# Patient Record
Sex: Male | Born: 2000
Health system: Southern US, Community
[De-identification: ages and names within clinical notes are randomized; demographics above are authoritative.]

## PROBLEM LIST (undated history)

## (undated) DIAGNOSIS — F319 Bipolar disorder, unspecified: Secondary | ICD-10-CM

## (undated) DIAGNOSIS — F419 Anxiety disorder, unspecified: Secondary | ICD-10-CM

## (undated) HISTORY — PX: CYST EXCISION: SHX5701

---
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
## 2020-02-28 ENCOUNTER — Emergency Department (HOSPITAL_COMMUNITY)
Admission: EM | Admit: 2020-02-28 | Discharge: 2020-02-28 | Disposition: A | Discharge status: Home / Self Care | Payer: Medicaid Other | Attending: Emergency Medicine | Admitting: Emergency Medicine

## 2020-02-28 ENCOUNTER — Emergency Department (HOSPITAL_COMMUNITY): Payer: Medicaid Other

## 2020-02-28 ENCOUNTER — Encounter (HOSPITAL_COMMUNITY): Payer: Self-pay | Admitting: *Deleted

## 2020-02-28 DIAGNOSIS — Y999 Unspecified external cause status: Secondary | ICD-10-CM | POA: Insufficient documentation

## 2020-02-28 DIAGNOSIS — S62339A Displaced fracture of neck of unspecified metacarpal bone, initial encounter for closed fracture: Secondary | ICD-10-CM

## 2020-02-28 DIAGNOSIS — W2209XA Striking against other stationary object, initial encounter: Secondary | ICD-10-CM | POA: Diagnosis not present

## 2020-02-28 DIAGNOSIS — S6992XA Unspecified injury of left wrist, hand and finger(s), initial encounter: Secondary | ICD-10-CM | POA: Diagnosis present

## 2020-02-28 DIAGNOSIS — Y92000 Kitchen of unspecified non-institutional (private) residence as  the place of occurrence of the external cause: Secondary | ICD-10-CM | POA: Insufficient documentation

## 2020-02-28 DIAGNOSIS — S60222A Contusion of left hand, initial encounter: Secondary | ICD-10-CM | POA: Insufficient documentation

## 2020-02-28 DIAGNOSIS — R937 Abnormal findings on diagnostic imaging of other parts of musculoskeletal system: Secondary | ICD-10-CM | POA: Diagnosis not present

## 2020-02-28 DIAGNOSIS — Y9389 Activity, other specified: Secondary | ICD-10-CM | POA: Diagnosis not present

## 2020-02-28 HISTORY — DX: Bipolar disorder, unspecified: F31.9

## 2020-02-28 MED ORDER — IBUPROFEN 600 MG PO TABS: 600.0000 mg | ORAL_TABLET | Freq: Four times a day (QID) | ORAL | 0 refills | Status: AC | PRN

## 2020-02-28 NOTE — Discharge Instructions (Signed)
As discussed your x-ray suggest the possibility of a subtle boxer's fracture of your fifth metacarpal bone as pointed out on your x-ray.  You also have deep bruising and swelling of your hand which will cause pain and difficulty making a full fist until this injury heals.  You have been placed in a splint to protect your injury until reexamined by orthopedics.  Call Dr. Romeo Apple for recheck appointment this coming week who will determine any ongoing need for this splint use.  In the interim ice and elevate your hand as much as possible.  Take the anti-inflammatory pain reliever prescribed to help you with pain symptoms.
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
## 2020-02-28 NOTE — ED Triage Notes (Signed)
Pt with left hand pain after hitting the kitchen counter on possible last night.  Small cuts from breaking the dishes.

## 2020-12-02 IMAGING — DX DG HAND COMPLETE 3+V*L*
3 series · 3 of 3 positions shown · non-contrast
Comparison: None.

CLINICAL DATA: Hit hand on solid object

EXAM:
LEFT HAND - COMPLETE 3+ VIEW

[hand pa]
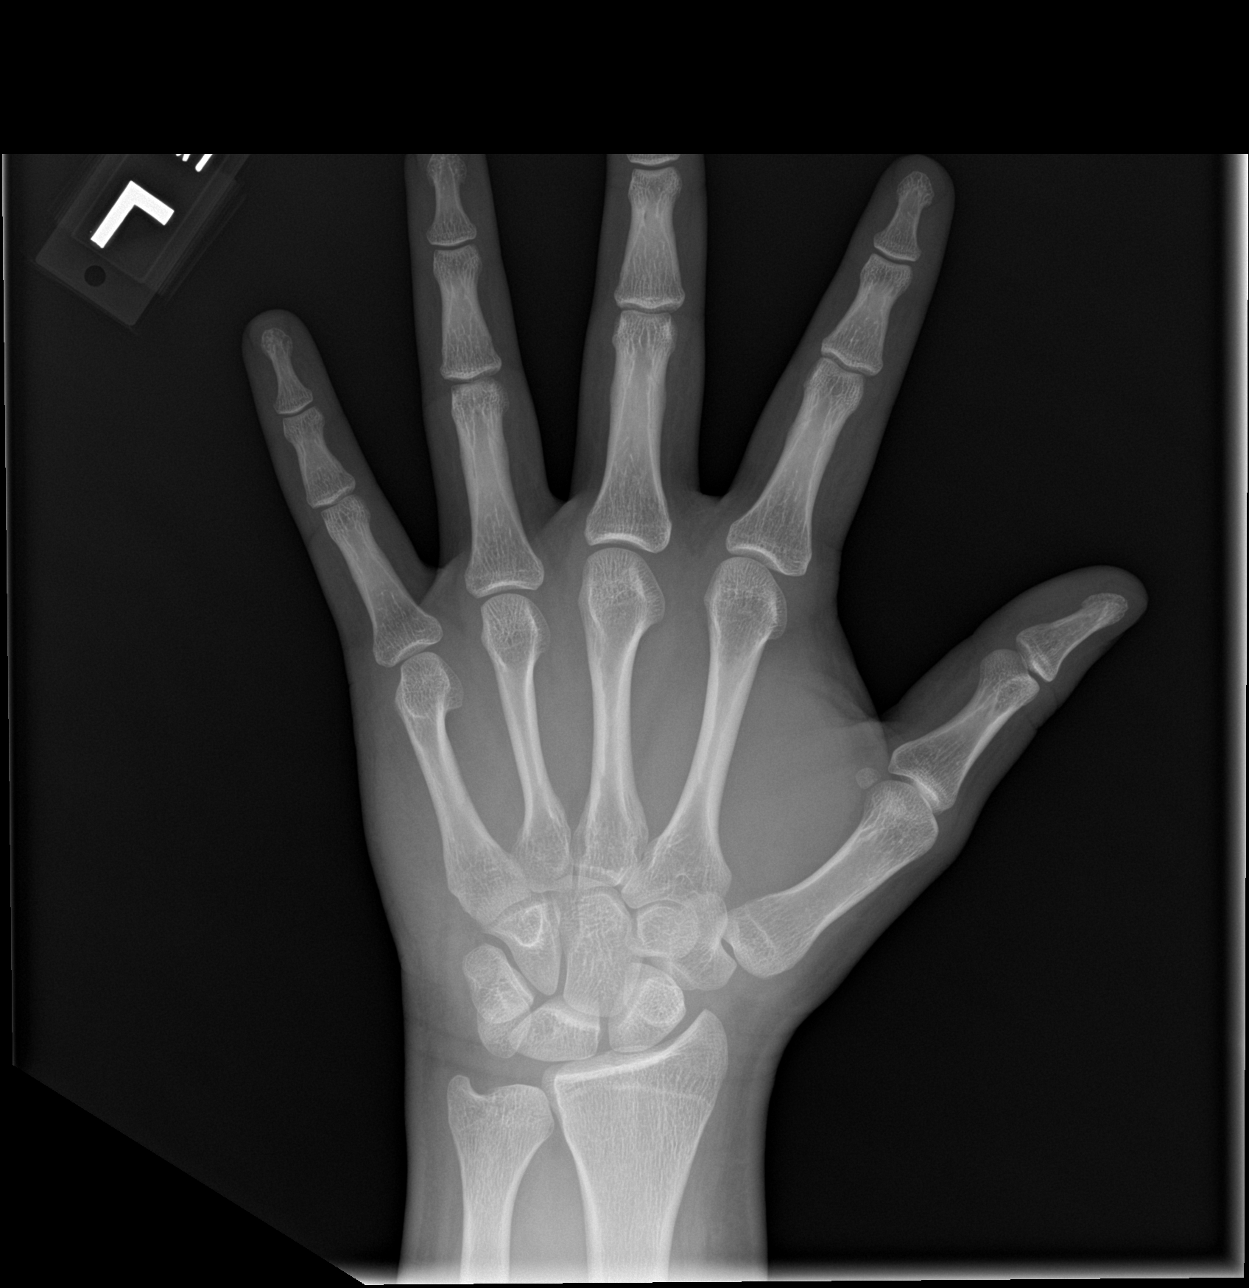

[hand obl]
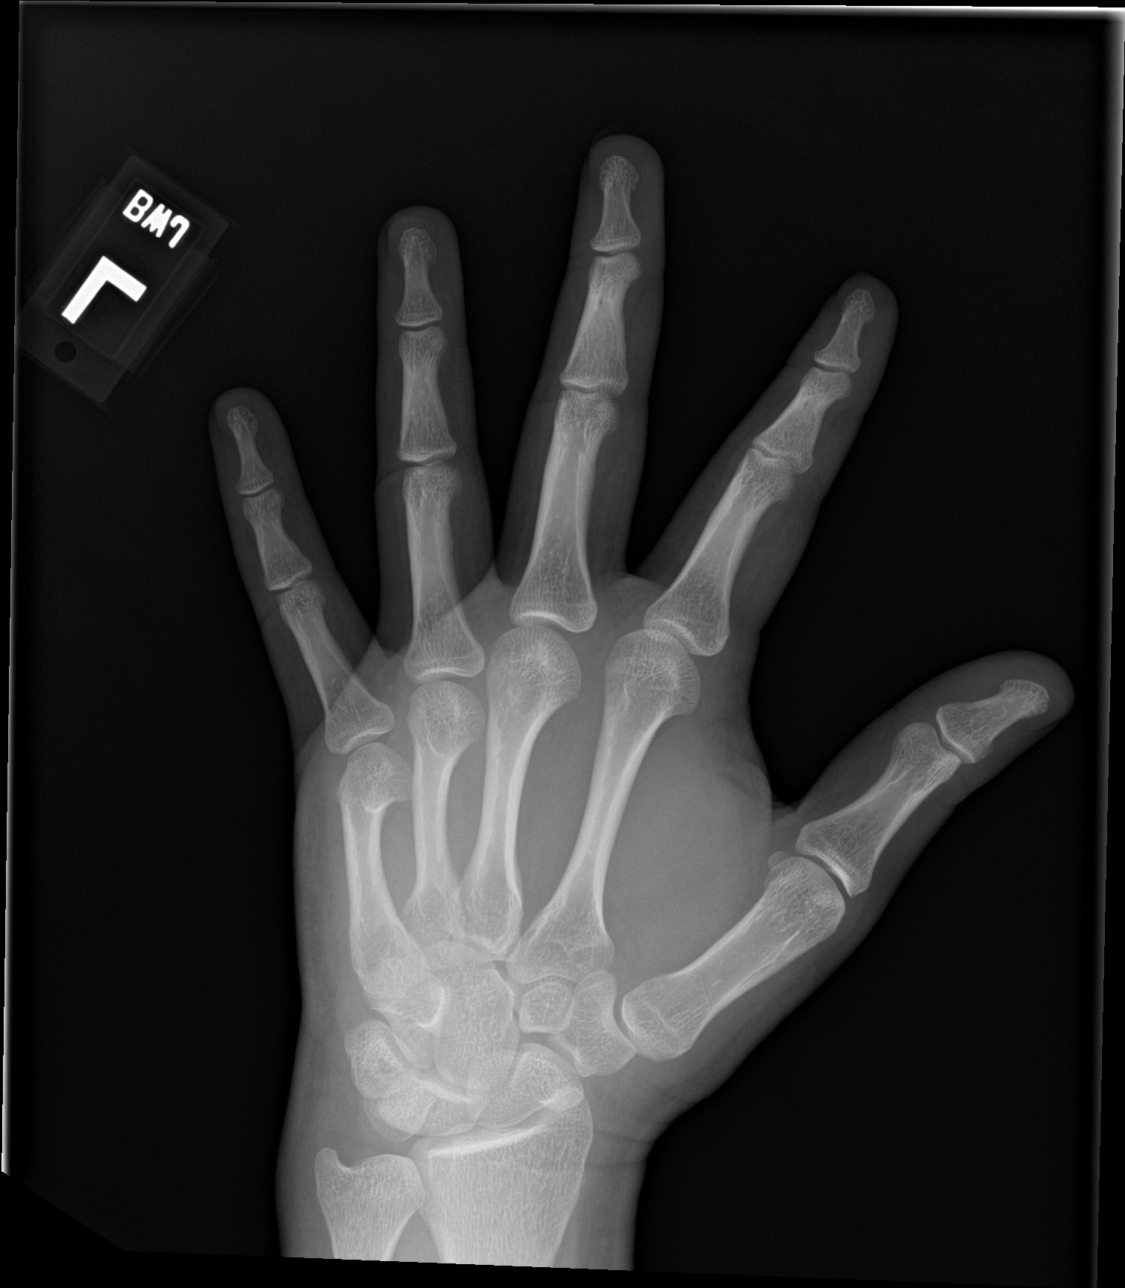

[hand lat]
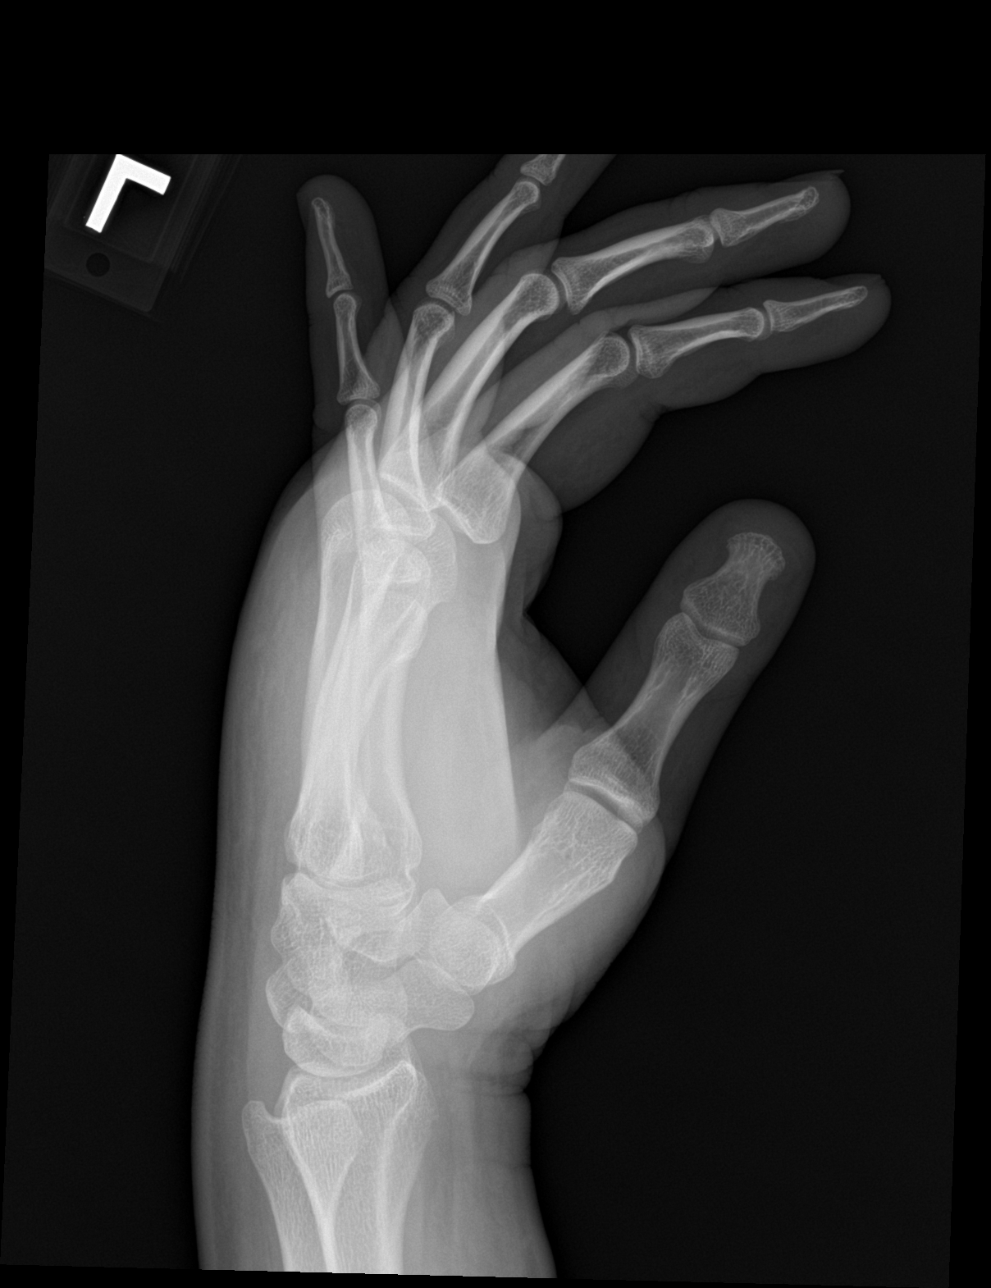

[3 of 3 positions shown; findings below may reference images not displayed]

FINDINGS: Frontal, oblique, and lateral views were obtained. There is a
questionable impaction injury along the distal radial metaphysis
with essentially anatomic alignment. No other findings suggesting
potential fracture. No dislocation. Joint spaces appear normal. No
erosive change. There is soft tissue swelling dorsally.
IMPRESSION: Soft tissue swelling dorsally. Question subtle impaction at the
distal left fifth metacarpal. Clinical assessment of this area
warranted for further evaluation. No other findings suggesting
potential fracture. No dislocation. No evident arthropathy.

## 2023-08-14 ENCOUNTER — Emergency Department (HOSPITAL_BASED_OUTPATIENT_CLINIC_OR_DEPARTMENT_OTHER)
Admission: EM | Admit: 2023-08-14 | Discharge: 2023-08-14 | Disposition: A | Discharge status: Home / Self Care | Payer: Medicaid Other | Attending: Emergency Medicine | Admitting: Emergency Medicine

## 2023-08-14 ENCOUNTER — Encounter (HOSPITAL_BASED_OUTPATIENT_CLINIC_OR_DEPARTMENT_OTHER): Payer: Self-pay | Admitting: Emergency Medicine

## 2023-08-14 ENCOUNTER — Other Ambulatory Visit: Payer: Self-pay

## 2023-08-14 DIAGNOSIS — R21 Rash and other nonspecific skin eruption: Secondary | ICD-10-CM

## 2023-08-14 MED ORDER — PREDNISONE 20 MG PO TABS: 40.0000 mg | ORAL_TABLET | Freq: Every day | ORAL | 0 refills | Status: AC

## 2023-08-14 MED ORDER — DIPHENHYDRAMINE HCL 25 MG PO TABS: 25.0000 mg | ORAL_TABLET | Freq: Four times a day (QID) | ORAL | 0 refills | Status: DC | PRN

## 2023-08-14 MED ORDER — HYDROCORTISONE 1 % EX CREA: TOPICAL_CREAM | CUTANEOUS | 0 refills | Status: DC

## 2023-08-14 NOTE — ED Notes (Signed)
Discharge instructions reviewed with patient. Patient verbalizes understanding, no further questions at this time. Medications/prescriptions and follow up information provided. No acute distress noted at time of departure.  

## 2023-08-14 NOTE — ED Provider Notes (Signed)
[facility] Provider Note   CSN: [id number] Arrival date & time: 08/14/23  1147     History  Chief Complaint  Patient presents with   Rash    Zachary Guzman is a 22 y.o. male with a past medical history significant for bipolar 1 disorder and anxiety who presents to the ED due to rash to upper and lower extremities for the past few weeks.  Rash is pruritic in nature.  Patient works for a [employer], therapeutic.  Believes he has poison oak.  Rash has begun to spread.  He has been using calamine lotion with no relief.  No fever or chills.  No neck stiffness.  No new products or medications.  Chest pain or shortness of breath.  History obtained from patient and past medical records. No interpreter used during encounter.       Home Medications Prior to Admission medications   Medication Sig Start Date End Date Taking? Authorizing Provider  diphenhydrAMINE (BENADRYL) 25 MG tablet Take 1 tablet (25 mg total) by mouth every 6 (six) hours as needed. 08/14/23  Yes Mannie Stabile, PA-C  hydrocortisone cream 1 % Apply to affected area 2 times daily 08/14/23  Yes Brayon Bielefeld, Merla Riches, PA-C  predniSONE (DELTASONE) 20 MG tablet Take 2 tablets (40 mg total) by mouth daily for 5 days. 08/14/23 08/19/23 Yes Jefry Lesinski, Merla Riches, PA-C  albuterol (PROVENTIL HFA;VENTOLIN HFA) 108 (90 Base) MCG/ACT inhaler Inhale 1-2 puffs into the lungs every 6 (six) hours as needed for wheezing. 12/01/17   Garlon Hatchet, PA-C  ARIPiprazole (ABILIFY) 10 MG tablet Take 1 tablet (10 mg total) by mouth daily. 11/04/18   Leata Mouse, MD  cloNIDine (CATAPRES) 0.1 MG tablet Take 1 tablet (0.1 mg total) by mouth at bedtime. 11/04/18   Leata Mouse, MD  ibuprofen (ADVIL) 600 MG tablet Take 1 tablet (600 mg total) by mouth every 6 (six) hours as needed. 02/28/20   Idol, Raynelle Fanning, PA-C  ondansetron (ZOFRAN) 4 MG tablet Take 1 tablet (4 mg total) by mouth every 8  (eight) hours as needed for nausea or vomiting. Patient not taking: Reported on 07/24/2018 06/19/18   Couture, Cortni S, PA-C  Oxcarbazepine (TRILEPTAL) 300 MG tablet Take 1 tablet (300 mg total) by mouth 2 (two) times daily. 11/04/18   Leata Mouse, MD      Allergies    Patient has no known allergies.    Review of Systems   Review of Systems  Constitutional:  Negative for fever.  Skin:  Positive for rash.    Physical Exam Updated Vital Signs BP (!) 143/88   Pulse (!) 102   Temp 97.7 F (36.5 C)   Resp 16   Ht 6' (1.829 m)   Wt 115.7 kg   SpO2 97%   BMI 34.58 kg/m  Physical Exam Vitals and nursing note reviewed.  Constitutional:      General: He is not in acute distress.    Appearance: He is not ill-appearing.  HENT:     Head: Normocephalic.  Eyes:     Pupils: Pupils are equal, round, and reactive to light.  Cardiovascular:     Rate and Rhythm: Normal rate and regular rhythm.     Pulses: Normal pulses.     Heart sounds: Normal heart sounds. No murmur heard.    No friction rub. No gallop.  Pulmonary:     Effort: Pulmonary effort is normal.     Breath sounds: Normal  breath sounds.  Abdominal:     General: Abdomen is flat. There is no distension.     Palpations: Abdomen is soft.     Tenderness: There is no abdominal tenderness. There is no guarding or rebound.  Musculoskeletal:        General: Normal range of motion.     Cervical back: Neck supple.  Skin:    General: Skin is warm and dry.     Comments: Rash to upper extremities with excoriations.  No drainage.  No purpura or petechiae.  Neurological:     General: No focal deficit present.     Mental Status: He is alert.  Psychiatric:        Mood and Affect: Mood normal.        Behavior: Behavior normal.     ED Results / Procedures / Treatments   Labs (all labs ordered are listed, but only abnormal results are displayed) Labs Reviewed - No data to display  EKG None  Radiology No results  found.  Procedures Procedures    Medications Ordered in ED Medications - No data to display  ED Course/ Medical Decision Making/ A&P                                 Medical Decision Making  22 year old male presents to the ED due to rash to upper and lower extremities for the past few weeks.  Believes it is poison oak from working outside.  No fever or chills.  Upon arrival, stable vitals.  Patient in no acute distress.  Rash to upper extremities with excoriations.  No evidence of superimposed bacterial infection.  Suspect symptoms likely related to poison oak/ivy versus allergic in nature.  No signs of respiratory distress.  No stridor or wheeze.  Given extensiveness of rash, will discharge with prednisone for the next 5 days.  Patient also discharged with topical steroids.  Benadryl for at night.  Low suspicion for meningitis.  No petechiae or purpura.  Patient stable for discharge. Strict ED precautions discussed with patient. Patient states understanding and agrees to plan. Patient discharged home in no acute distress and stable vitals  No PCP Financial restraints- no health insurance       Final Clinical Impression(s) / ED Diagnoses Final diagnoses:  Rash    Rx / DC Orders ED Discharge Orders          Ordered    predniSONE (DELTASONE) 20 MG tablet  Daily        08/14/23 1333    hydrocortisone cream 1 %        08/14/23 1333    diphenhydrAMINE (BENADRYL) 25 MG tablet  Every 6 hours PRN        08/14/23 1333              Mannie Stabile, PA-C 08/14/23 1335    Loetta Rough, MD 08/15/23 1851

## 2023-08-14 NOTE — Discharge Instructions (Addendum)
It was a pleasure taking care of you today.  As discussed, I am sending you home with steroids.  Take for the next 5 days. I am also sending you home with topical steroids.  You may also use Benadryl at night to help with itching.  Unfortunately we do not give flu shots here in the ER.  You may report to the pharmacy here to get your flu shot.  I have included the number of [facility].  Please call to schedule an appointment to establish care.  Return to the ER for any worsening symptoms.

## 2023-08-14 NOTE — ED Triage Notes (Signed)
Pt c/o "poison oak" to BUE, LUE and face

## 2023-08-27 ENCOUNTER — Other Ambulatory Visit: Payer: Self-pay

## 2023-08-27 ENCOUNTER — Encounter (HOSPITAL_COMMUNITY): Payer: Self-pay

## 2023-08-27 ENCOUNTER — Emergency Department (HOSPITAL_COMMUNITY)
Admission: EM | Admit: 2023-08-27 | Discharge: 2023-08-27 | Disposition: A | Discharge status: Home / Self Care | Payer: Self-pay | Attending: Emergency Medicine | Admitting: Emergency Medicine

## 2023-08-27 DIAGNOSIS — R21 Rash and other nonspecific skin eruption: Secondary | ICD-10-CM | POA: Insufficient documentation

## 2023-08-27 MED ORDER — PREDNISONE 10 MG (21) PO TBPK: ORAL_TABLET | Freq: Every day | ORAL | 0 refills | Status: DC

## 2023-08-27 MED ORDER — CETIRIZINE HCL 10 MG PO TABS: 10.0000 mg | ORAL_TABLET | Freq: Every day | ORAL | 0 refills | Status: AC

## 2023-08-27 MED ORDER — HYDROXYZINE HCL 25 MG PO TABS: 25.0000 mg | ORAL_TABLET | Freq: Four times a day (QID) | ORAL | 0 refills | Status: DC

## 2023-08-27 MED ORDER — CETIRIZINE HCL 10 MG PO TABS: 10.0000 mg | ORAL_TABLET | Freq: Every day | ORAL | 0 refills | Status: DC

## 2023-08-27 NOTE — ED Provider Notes (Signed)
EMERGENCY DEPARTMENT AT [facility] Provider Note   CSN: [id number] Arrival date & time: 08/27/23  1242     History  Chief Complaint  Patient presents with   Rash    Zachary Guzman is a 22 y.o. male no significant past medical history here for evaluation of rash.  Patient works outside International aid/development worker.  A few weeks ago he was seen as he developed a pruritic rash after being in poison oak and poison ivy.  He was seen here in the emergency department and given steroids, hydrocortisone and antihistamine.  He states he completed the course and his symptoms had completely resolved.  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  He states later that evening he noted a pruritic rash to his bilateral forearms and face.  Subsequently when he woke up the next day after itching his chest he noticed a rash to this area as well.  He was wearing pants and he does not have any rashes to his lower extremities.  Nothing on the back, GU.  No mucous membranes, rash to palms or soles.  He denies any known insect or tick bites.  He denies any concern for bedbugs.  No rash to interdigit space.  Rash is pruritic in nature.  Nontender.  No fever, nausea, vomiting, abdominal pain.  No changes in medications.  He is not currently on any prescription medication.  No shortness of breath, urticaria, sensation of throat closing.  No history of allergic reaction.  HPI     Home Medications Prior to Admission medications   Medication Sig Start Date End Date Taking? Authorizing Provider  hydrOXYzine (ATARAX) 25 MG tablet Take 1 tablet (25 mg total) by mouth every 6 (six) hours. 08/27/23  Yes Martiza Speth A, PA-C  albuterol (PROVENTIL HFA;VENTOLIN HFA) 108 (90 Base) MCG/ACT inhaler Inhale 1-2 puffs into the lungs every 6 (six) hours as needed for wheezing.  12/01/17   Garlon Hatchet, PA-C  ARIPiprazole (ABILIFY) 10 MG tablet Take 1 tablet (10 mg total) by mouth daily. 11/04/18   Leata Mouse, MD  cetirizine (ZYRTEC ALLERGY) 10 MG tablet Take 1 tablet (10 mg total) by mouth daily. 08/27/23   Numan Zylstra A, PA-C  cloNIDine (CATAPRES) 0.1 MG tablet Take 1 tablet (0.1 mg total) by mouth at bedtime. 11/04/18   Leata Mouse, MD  diphenhydrAMINE (BENADRYL) 25 MG tablet Take 1 tablet (25 mg total) by mouth every 6 (six) hours as needed. 08/14/23   Mannie Stabile, PA-C  hydrocortisone cream 1 % Apply to affected area 2 times daily 08/14/23   Mannie Stabile, PA-C  ibuprofen (ADVIL) 600 MG tablet Take 1 tablet (600 mg total) by mouth every 6 (six) hours as needed. 02/28/20   Idol, Raynelle Fanning, PA-C  ondansetron (ZOFRAN) 4 MG tablet Take 1 tablet (4 mg total) by mouth every 8 (eight) hours as needed for nausea or vomiting. Patient not taking: Reported on 07/24/2018 06/19/18   Couture, Cortni S, PA-C  Oxcarbazepine (TRILEPTAL) 300 MG tablet Take 1 tablet (300 mg total) by mouth 2 (two) times daily. 11/04/18   Leata Mouse, MD  predniSONE (STERAPRED UNI-PAK 21 TAB) 10 MG (21) TBPK tablet Take by mouth daily. Take 6 tabs by mouth daily  for 1 days, then 5 tabs for 1 days, then 4 tabs for  1 days, then 3 tabs for 1 days, 2 tabs for 1 days, then 1 tab by mouth daily for 1 days 08/27/23   Felica Chargois A, PA-C      Allergies    Patient has no known allergies.    Review of Systems   Review of Systems  Constitutional: Negative.   HENT: Negative.    Respiratory: Negative.    Cardiovascular: Negative.   Gastrointestinal: Negative.   Genitourinary: Negative.   Musculoskeletal: Negative.   Skin:  Positive for rash.  Neurological: Negative.   All other systems reviewed and are negative.   Physical Exam Updated Vital Signs BP (!) 182/91 (BP Location: Left Arm)   Pulse (!) 101   Temp 98.1 F (36.7 C) (Oral)   Resp 18   Wt  115 kg   SpO2 94%   BMI 34.38 kg/m  Physical Exam Vitals and nursing note reviewed.  Constitutional:      General: He is not in acute distress.    Appearance: He is well-developed. He is not ill-appearing, toxic-appearing or diaphoretic.  HENT:     Head: Normocephalic and atraumatic.  Eyes:     Pupils: Pupils are equal, round, and reactive to light.  Cardiovascular:     Rate and Rhythm: Normal rate and regular rhythm.  Pulmonary:     Effort: Pulmonary effort is normal. No respiratory distress.  Abdominal:     General: There is no distension.     Palpations: Abdomen is soft.  Musculoskeletal:        General: Normal range of motion.     Cervical back: Normal range of motion and neck supple.  Skin:    General: Skin is warm and dry.     Capillary Refill: Capillary refill takes less than 2 seconds.     Findings: Rash present.     Comments: See picture in chart.  Erythematous splotchy rash to bilateral forearms, forehead, anterior chest.  No rash to legs.  No target lesions, bulla, desquamated skin, vesicles, eschar, fluctuance or induration.  No linear rash in interdigit webspaces.  No oral mucous membrane lesions.  Neurological:     General: No focal deficit present.     Mental Status: He is alert and oriented to person, place, and time.        ED Results / Procedures / Treatments   Labs (all labs ordered are listed, but only abnormal results are displayed) Labs Reviewed - No data to display  EKG None  Radiology No results found.  Procedures Procedures    Medications Ordered in ED Medications - No data to display  ED Course/ Medical Decision Making/ A&P   22 year old here for evaluation of rash.  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  Had something similar a few months ago.  His symptoms resolved after using antihistamine as well as prednisone.  Now back to work on Thursday  had recurrence of symptoms.  There is no evidence of anaphylaxis or allergic reaction.  No target lesions, bulla, desquamated skin.  No evidence of RMSF, Lyme rash.  He has no fluctuance, induration to suggest abscess.  No mucous membrane involvement, no rash to palms or soles, no GU lesions.  No abdominal pain, nausea, vomiting to suggest cholestasis rash.  No rash to interdigit webspace to suggest scabies.  Rash consistent with contact dermatitis. Patient denies any difficulty breathing or swallowing.  Pt has a  patent airway without stridor and is handling secretions without difficulty; no angioedema. No blisters, no pustules, no warmth, no draining sinus tracts, no superficial abscesses, no bullous impetigo, no vesicles, no desquamation, no target lesions with dusky purpura or a central bulla. Not tender to touch. No concern for superimposed infection. No concern for SJS, TEN, TSS, tick borne illness, syphilis or other life-threatening condition. Will discharge home with short course of steroids, zyrtec and recommend hydroxyzine as needed for pruritis.   The patient has been appropriately medically screened and/or stabilized in the ED. I have low suspicion for any other emergent medical condition which would require further screening, evaluation or treatment in the ED or require inpatient management.  Patient is hemodynamically stable and in no acute distress.  Patient able to ambulate in department prior to ED.  Evaluation does not show acute pathology that would require ongoing or additional emergent interventions while in the emergency department or further inpatient treatment.  I have discussed the diagnosis with the patient and answered all questions.  Pain is been managed while in the emergency department and patient has no further complaints prior to discharge.  Patient is comfortable with plan discussed in room and is stable for discharge at this time.  I have discussed strict return precautions for  returning to the emergency department.  Patient was encouraged to follow-up with PCP/specialist refer to at discharge.                                 Medical Decision Making Amount and/or Complexity of Data Reviewed External Data Reviewed: labs, radiology and notes.  Risk OTC drugs. Prescription drug management. Decision regarding hospitalization. Diagnosis or treatment significantly limited by social determinants of health.         Final Clinical Impression(s) / ED Diagnoses Final diagnoses:  Rash    Rx / DC Orders ED Discharge Orders          Ordered    predniSONE (STERAPRED UNI-PAK 21 TAB) 10 MG (21) TBPK tablet  Daily,   Status:  Discontinued        08/27/23 1528    cetirizine (ZYRTEC ALLERGY) 10 MG tablet  Daily,   Status:  Discontinued        08/27/23 1528    cetirizine (ZYRTEC ALLERGY) 10 MG tablet  Daily        08/27/23 1531    predniSONE (STERAPRED UNI-PAK 21 TAB) 10 MG (21) TBPK tablet  Daily        08/27/23 1531    hydrOXYzine (ATARAX) 25 MG tablet  Every 6 hours        08/27/23 1532              Ozelle Brubacher A, PA-C 08/27/23 1545    Gwyneth Sprout, MD 08/27/23 2312

## 2023-08-27 NOTE — ED Triage Notes (Signed)
C/o pruritic rash to torso, bilateral upper and lower extremities, and face for weeks.  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

## 2023-08-27 NOTE — Discharge Instructions (Addendum)
It was a pleasure taking care of you here in the emergency department.  We have discharged on steroids as well as an antihistamine, Zyrtec as needed for your rash.  May also use over-the-counter hydrocortisone cream.  I have written for some hydroxyzine which can help with itching.  Do not take Benadryl while taking this medication  I would recommend trying to withhold from itching as this will cause the rash to spread.  When you are at work I recommend wearing long sleeves and long pants to prevent reexposure  Return for any worsening symptoms

## 2023-09-26 ENCOUNTER — Encounter (HOSPITAL_BASED_OUTPATIENT_CLINIC_OR_DEPARTMENT_OTHER): Payer: Self-pay

## 2023-09-26 ENCOUNTER — Emergency Department (HOSPITAL_BASED_OUTPATIENT_CLINIC_OR_DEPARTMENT_OTHER)
Admission: EM | Admit: 2023-09-26 | Discharge: 2023-09-26 | Disposition: A | Discharge status: Home / Self Care | Payer: Self-pay | Attending: Emergency Medicine | Admitting: Emergency Medicine

## 2023-09-26 DIAGNOSIS — J301 Allergic rhinitis due to pollen: Secondary | ICD-10-CM | POA: Insufficient documentation

## 2023-09-26 DIAGNOSIS — L237 Allergic contact dermatitis due to plants, except food: Secondary | ICD-10-CM

## 2023-09-26 DIAGNOSIS — L247 Irritant contact dermatitis due to plants, except food: Secondary | ICD-10-CM

## 2023-09-26 MED ORDER — FAMOTIDINE IN NACL 20-0.9 MG/50ML-% IV SOLN
20.0000 mg | Freq: Once | INTRAVENOUS | Status: DC
  Filled 2023-09-26: qty 50

## 2023-09-26 MED ORDER — METHYLPREDNISOLONE SODIUM SUCC 125 MG IJ SOLR
125.0000 mg | Freq: Once | INTRAMUSCULAR | Status: AC
  Administered 2023-09-26: 125 mg via INTRAMUSCULAR
  Filled 2023-09-26: qty 2

## 2023-09-26 MED ORDER — PREDNISONE 10 MG PO TABS: 20.0000 mg | ORAL_TABLET | Freq: Every day | ORAL | 0 refills | Status: DC

## 2023-09-26 MED ORDER — FAMOTIDINE 20 MG PO TABS
40.0000 mg | ORAL_TABLET | Freq: Once | ORAL | Status: AC
  Administered 2023-09-26: 40 mg via ORAL
  Filled 2023-09-26: qty 2

## 2023-09-26 MED ORDER — METHYLPREDNISOLONE SODIUM SUCC 125 MG IJ SOLR
125.0000 mg | Freq: Once | INTRAMUSCULAR | Status: DC
  Filled 2023-09-26: qty 2

## 2023-09-26 MED ORDER — SODIUM CHLORIDE 0.9 % IV BOLUS: 1000.0000 mL | Freq: Once | INTRAVENOUS | Status: DC

## 2023-09-26 MED ORDER — EPINEPHRINE 0.3 MG/0.3ML IJ SOAJ
0.3000 mg | Freq: Once | INTRAMUSCULAR | Status: DC
  Filled 2023-09-26: qty 0.3

## 2023-09-26 MED ORDER — DIPHENHYDRAMINE HCL 25 MG PO CAPS
25.0000 mg | ORAL_CAPSULE | Freq: Once | ORAL | Status: AC
  Administered 2023-09-26: 25 mg via ORAL
  Filled 2023-09-26: qty 1

## 2023-09-26 MED ORDER — DIPHENHYDRAMINE HCL 25 MG PO CAPS: 50.0000 mg | ORAL_CAPSULE | Freq: Once | ORAL | Status: DC

## 2023-09-26 NOTE — ED Triage Notes (Addendum)
Works for a [employer] and got into poison ivy on Monday. Started having facial swelling and rash. Also states on genitalia. Denies trouble breathing/swallowing

## 2023-09-26 NOTE — ED Provider Notes (Signed)
[place] EMERGENCY DEPARTMENT AT [facility] Provider Note   CSN: [id number] Arrival date & time: 09/26/23  1032     History  Chief Complaint  Patient presents with   Poison Ivy   Facial Swelling    Zachary Guzman is a 22 y.o. male history of allergic reaction to poison ivy presented for allergic reaction to poison ivy.  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  Patient dates he got in the ER and got on his face but denies breathing exam.  Patient states his face is gotten red and puffy but has been eating and drink without issue denies chest pain, shortness of breath, wheezing.  Patient states he still able to swallow his own saliva.  Patient also notes that his genitals this morning appeared red and swollen but denies any pain states they are itching.  Home Medications Prior to Admission medications   Medication Sig Start Date End Date Taking? Authorizing Provider  predniSONE (DELTASONE) 10 MG tablet Take 2 tablets (20 mg total) by mouth daily. 09/26/23  Yes Freddie Nghiem, Beverly Gust, PA-C  albuterol (PROVENTIL HFA;VENTOLIN HFA) 108 (90 Base) MCG/ACT inhaler Inhale 1-2 puffs into the lungs every 6 (six) hours as needed for wheezing. 12/01/17   Garlon Hatchet, PA-C  ARIPiprazole (ABILIFY) 10 MG tablet Take 1 tablet (10 mg total) by mouth daily. 11/04/18   Leata Mouse, MD  cetirizine (ZYRTEC ALLERGY) 10 MG tablet Take 1 tablet (10 mg total) by mouth daily. 08/27/23   Henderly, Britni A, PA-C  cloNIDine (CATAPRES) 0.1 MG tablet Take 1 tablet (0.1 mg total) by mouth at bedtime. 11/04/18   Leata Mouse, MD  diphenhydrAMINE (BENADRYL) 25 MG tablet Take 1 tablet (25 mg total) by mouth every 6 (six) hours as needed. 08/14/23   Mannie Stabile, PA-C  hydrocortisone cream 1 % Apply to affected area 2 times daily 08/14/23   Mannie Stabile, PA-C  hydrOXYzine (ATARAX) 25 MG tablet Take 1 tablet (25 mg  total) by mouth every 6 (six) hours. 08/27/23   Henderly, Britni A, PA-C  ibuprofen (ADVIL) 600 MG tablet Take 1 tablet (600 mg total) by mouth every 6 (six) hours as needed. 02/28/20   Idol, Raynelle Fanning, PA-C  ondansetron (ZOFRAN) 4 MG tablet Take 1 tablet (4 mg total) by mouth every 8 (eight) hours as needed for nausea or vomiting. Patient not taking: Reported on 07/24/2018 06/19/18   Couture, Cortni S, PA-C  Oxcarbazepine (TRILEPTAL) 300 MG tablet Take 1 tablet (300 mg total) by mouth 2 (two) times daily. 11/04/18   Leata Mouse, MD      Allergies    Patient has no known allergies.    Review of Systems   Review of Systems  Physical Exam Updated Vital Signs BP (!) 158/110 (BP Location: Right Arm)   Pulse 92   Temp 98 F (36.7 C) (Oral)   Resp 18   Ht 6' (1.829 m)   Wt 117.9 kg   SpO2 99%   BMI 35.26 kg/m  Physical Exam Constitutional:      General: He is not in acute distress. HENT:     Head:     Comments: Erythema and edema throughout Voice not muffled Tolerate secretions    Mouth/Throat:     Comments: No mucosal involvement Neck:     Comments: Nonedematous Cardiovascular:     Rate and Rhythm: Normal rate and regular rhythm.  Pulses: Normal pulses.     Heart sounds: Normal heart sounds.  Pulmonary:     Effort: Pulmonary effort is normal. No respiratory distress.     Breath sounds: Normal breath sounds.  Abdominal:     Palpations: Abdomen is soft.     Tenderness: There is no abdominal tenderness. There is no guarding or rebound.  Genitourinary:    Comments: Chaperone:Alie, RN Erythema and mildly edematous scrotum is nontender to palpation No enlarged or tender lymph nodes No lesions Musculoskeletal:     Cervical back: Normal range of motion.  Skin:    General: Skin is warm and dry.  Neurological:     Mental Status: He is alert.     ED Results / Procedures / Treatments   Labs (all labs ordered are listed, but only abnormal results are  displayed) Labs Reviewed - No data to display  EKG None  Radiology No results found.  Procedures Procedures    Medications Ordered in ED Medications  famotidine (PEPCID) tablet 40 mg (has no administration in time range)  methylPREDNISolone sodium succinate (SOLU-MEDROL) 125 mg/2 mL injection 125 mg (has no administration in time range)  diphenhydrAMINE (BENADRYL) capsule 25 mg (has no administration in time range)    ED Course/ Medical Decision Making/ A&P                                 Medical Decision Making Risk Prescription drug management.   [name] 22 y.o. presented today for allergic reaction.  Working DDx that I considered at this time includes, but not limited to, simple allergic reaction, anaphylaxis, anaphylactic shock, airway compromise, SJS/TEN, medication induced.  R/o DDx: anaphylaxis, anaphylactic shock, airway compromise, SJS/TEN, medication induced: These are considered less likely due to history of present illness, physical exam, labs/imaging findings  Review of prior external notes: 08/27/2023 ED  Unique Tests and My Interpretation: None  Social Determinants of Health: none  Discussion with Independent Historian:  Family  Discussion of Management of Tests: None  Risk: Medium: prescription drug management  Risk Stratification Score: None  Staffed with [place], MD  Plan: On exam patient was no acute distress stable vitals.  Patient's with exam does show erythema of facial swelling however airway is intact and he is tolerating secretions.  Patient denied any pain but is endorses pruritic rash.  After discussing with the attending patient is not having anaphylactic reaction so will not do epi.  Will give him oral antihistamines and steroids and prescribe some steroids and follow-up with his primary care provider.  Patient was offered an EpiPen prescription however declined at this time.  Encourage patient to follow-up with a primary care  provider and to avoid poison ivy in the future.  Patient was given return precautions. Patient stable for discharge at this time.  Patient verbalized understanding of plan.  This chart was dictated using voice recognition software.  Despite best efforts to proofread,  errors can occur which can change the documentation meaning.         Final Clinical Impression(s) / ED Diagnoses Final diagnoses:  Allergic reaction, initial encounter    Rx / DC Orders ED Discharge Orders          Ordered    predniSONE (DELTASONE) 10 MG tablet  Daily        09/26/23 1110              Netta Corrigan, [place]  09/26/23 1116    Ernie Avena, MD 09/26/23 1256

## 2023-09-26 NOTE — Discharge Instructions (Addendum)
Please follow up with your primary care provider regards recent ER visit.  Today you are treated for an allergic reaction and have prescribed few steroids to take.  Please monitor symptoms if symptoms change or worsen please return to the ER.

## 2024-07-02 ENCOUNTER — Emergency Department (HOSPITAL_COMMUNITY)
Admission: EM | Admit: 2024-07-02 | Discharge: 2024-07-02 | Disposition: A | Discharge status: Home / Self Care | Payer: Self-pay | Attending: Emergency Medicine | Admitting: Emergency Medicine

## 2024-07-02 ENCOUNTER — Other Ambulatory Visit: Payer: Self-pay

## 2024-07-02 ENCOUNTER — Encounter (HOSPITAL_COMMUNITY): Payer: Self-pay

## 2024-07-02 DIAGNOSIS — L237 Allergic contact dermatitis due to plants, except food: Secondary | ICD-10-CM | POA: Insufficient documentation

## 2024-07-02 MED ORDER — PREDNISONE 10 MG (21) PO TBPK: ORAL_TABLET | Freq: Every day | ORAL | 0 refills | Status: AC

## 2024-07-02 MED ORDER — HYDROXYZINE HCL 25 MG PO TABS: 25.0000 mg | ORAL_TABLET | Freq: Four times a day (QID) | ORAL | 0 refills | Status: AC | PRN

## 2024-07-02 NOTE — Discharge Instructions (Signed)
 Take Prednisone  as prescribed and complete the full course. Hydroxyzine  as prescribed as needed for itching. Do not take if driving.

## 2024-07-02 NOTE — ED Triage Notes (Signed)
 PT arrives via POV. PT states he has poison ivy all over. Pt AxOx4.

## 2024-07-02 NOTE — ED Provider Notes (Signed)
 [facility] Provider Note   CSN: [id number] Arrival date & time: 07/02/24  1437     Patient presents with: Poison Ivy   Zachary Guzman is a 23 y.o. male.   23 yo male with poison ivy to bilateral arms after exposure to poison ivy a few days ago. Woke up today with eyes swollen shut, has since improved.        Prior to Admission medications   Medication Sig Start Date End Date Taking? Authorizing Provider  hydrOXYzine  (ATARAX ) 25 MG tablet Take 1 tablet (25 mg total) by mouth every 6 (six) hours as needed for itching. 07/02/24  Yes Beverley Leita LABOR, PA-C  predniSONE  (STERAPRED UNI-PAK 21 TAB) 10 MG (21) TBPK tablet Take by mouth daily. Take 6 tabs by mouth daily  for 2 days, then 5 tabs for 2 days, then 4 tabs for 2 days, then 3 tabs for 2 days, 2 tabs for 2 days, then 1 tab by mouth daily for 2 days 07/02/24  Yes Beverley Leita LABOR, PA-C  albuterol  (PROVENTIL  HFA;VENTOLIN  HFA) 108 (90 Base) MCG/ACT inhaler Inhale 1-2 puffs into the lungs every 6 (six) hours as needed for wheezing. 12/01/17   Jarold Olam HERO, PA-C  ARIPiprazole  (ABILIFY ) 10 MG tablet Take 1 tablet (10 mg total) by mouth daily. 11/04/18   Jonnalagadda, [name], MD  cetirizine  (ZYRTEC  ALLERGY) 10 MG tablet Take 1 tablet (10 mg total) by mouth daily. 08/27/23   Henderly, Britni A, PA-C  cloNIDine  (CATAPRES ) 0.1 MG tablet Take 1 tablet (0.1 mg total) by mouth at bedtime. 11/04/18   Jonnalagadda, [name], MD  ibuprofen  (ADVIL ) 600 MG tablet Take 1 tablet (600 mg total) by mouth every 6 (six) hours as needed. 02/28/20   Idol, Julie, PA-C  ondansetron  (ZOFRAN ) 4 MG tablet Take 1 tablet (4 mg total) by mouth every 8 (eight) hours as needed for nausea or vomiting. Patient not taking: Reported on 07/24/2018 06/19/18   Couture, Cortni S, PA-C  Oxcarbazepine  (TRILEPTAL ) 300 MG tablet Take 1 tablet (300 mg total) by mouth 2 (two) times daily. 11/04/18   Jonnalagadda, [name], MD    Allergies:  Poison ivy extract    Review of Systems Negative except as per HPI Updated Vital Signs BP (!) 145/100 (BP Location: Left Arm)   Pulse 86   Temp (!) 97.5 F (36.4 C)   Resp 17   SpO2 96%   Physical Exam Vitals and nursing note reviewed.  Constitutional:      General: He is not in acute distress.    Appearance: He is well-developed. He is not diaphoretic.  HENT:     Head: Normocephalic and atraumatic.  Eyes:     Conjunctiva/sclera: Conjunctivae normal.  Pulmonary:     Effort: Pulmonary effort is normal. No respiratory distress.  Musculoskeletal:        General: No swelling. Normal range of motion.  Skin:    General: Skin is warm and dry.     Capillary Refill: Capillary refill takes less than 2 seconds.     Findings: Rash present.     Comments: Papular rash to bilateral forearms, occasional clusters. No pustules, no secondary infection.   Neurological:     Mental Status: He is alert and oriented to person, place, and time.  Psychiatric:        Behavior: Behavior normal.     (all labs ordered are listed, but only abnormal results are displayed) Labs Reviewed - No data to  display  EKG: None  Radiology: No results found.   Procedures   Medications Ordered in the ED - No data to display                                  Medical Decision Making Risk Prescription drug management.   23 yo male with complaint of rash consistent with poison ivy to bilateral forearms without concern for infection. Will treat with prednisone  taper. Atarax  as needed as prescribed for itching.      Final diagnoses:  Poison ivy dermatitis    ED Discharge Orders          Ordered    predniSONE  (STERAPRED UNI-PAK 21 TAB) 10 MG (21) TBPK tablet  Daily        07/02/24 1538    hydrOXYzine  (ATARAX ) 25 MG tablet  Every 6 hours PRN        07/02/24 1538               Beverley Leita LABOR, PA-C 07/02/24 1544    Rogelia Jerilynn RAMAN, MD 07/02/24 1925
